# Patient Record
Sex: Female | Born: 1985 | Hispanic: No | Marital: Single | Smoking: Never smoker
Health system: Southern US, Community
[De-identification: ages and names within clinical notes are randomized; demographics above are authoritative.]

## PROBLEM LIST (undated history)

## (undated) DIAGNOSIS — J45909 Unspecified asthma, uncomplicated: Secondary | ICD-10-CM

## (undated) DIAGNOSIS — F419 Anxiety disorder, unspecified: Secondary | ICD-10-CM

## (undated) HISTORY — PX: PLACEMENT OF BREAST IMPLANTS: SHX6334

---

## 2014-10-11 ENCOUNTER — Emergency Department (HOSPITAL_COMMUNITY)
Admission: EM | Admit: 2014-10-11 | Discharge: 2014-10-11 | Disposition: A | Discharge status: Home / Self Care | Payer: PRIVATE HEALTH INSURANCE | Attending: Emergency Medicine | Admitting: Emergency Medicine

## 2014-10-11 ENCOUNTER — Encounter (HOSPITAL_COMMUNITY): Payer: Self-pay | Admitting: Family Medicine

## 2014-10-11 DIAGNOSIS — G479 Sleep disorder, unspecified: Secondary | ICD-10-CM | POA: Diagnosis not present

## 2014-10-11 DIAGNOSIS — J45901 Unspecified asthma with (acute) exacerbation: Secondary | ICD-10-CM | POA: Diagnosis not present

## 2014-10-11 DIAGNOSIS — R0602 Shortness of breath: Secondary | ICD-10-CM | POA: Insufficient documentation

## 2014-10-11 DIAGNOSIS — Z880 Allergy status to penicillin: Secondary | ICD-10-CM | POA: Insufficient documentation

## 2014-10-11 DIAGNOSIS — F419 Anxiety disorder, unspecified: Secondary | ICD-10-CM | POA: Diagnosis present

## 2014-10-11 HISTORY — DX: Unspecified asthma, uncomplicated: J45.909

## 2014-10-11 HISTORY — DX: Anxiety disorder, unspecified: F41.9

## 2014-10-11 MED ORDER — PAROXETINE HCL 20 MG PO TABS: 20.0000 mg | ORAL_TABLET | ORAL | Status: DC

## 2014-10-11 NOTE — Discharge Instructions (Signed)
Social Anxiety Disorder °Social anxiety disorder, previously called social phobia, is a mental disorder. People with social anxiety disorder frequently feel nervous, afraid, or embarrassed when around other people in social situations. They constantly worry that other people are judging or criticizing them for how they look, what they say, or how they act. They may worry that other people might reject them because of their appearance or behavior. °Social anxiety disorder is more than just occasional shyness or self-consciousness. It can cause severe emotional distress. It can interfere with daily life activities. Social anxiety disorder also may lead to excessive alcohol or drug use and even suicide.  °Social anxiety disorder is actually one of the most common mental disorders. It can develop at any time but usually starts in the teenage years. Women are more commonly affected than men. Social anxiety disorder is also more common in people who have family members with anxiety disorders. It also is more common in people who have physical deformities or conditions with characteristics that are obvious to others, such as stuttered speech or movement abnormalities (Parkinson disease).  °SYMPTOMS  °In addition to feeling anxious or fearful in social situations, people with social anxiety disorder frequently have physical symptoms. Examples include: °· Red face (blushing). °· Racing heart. °· Sweating. °· Shaky hands or voice. °· Confusion. °· Light-headedness. °· Upset stomach and diarrhea. °DIAGNOSIS  °Social anxiety disorder is diagnosed through an assessment by your health care provider. Your health care provider will ask you questions about your mood, thoughts, and reactions in social situations. Your health care provider may ask you about your medical history and use of alcohol or drugs, including prescription medicines. Certain medical conditions and the use of certain substances, including caffeine, can cause  symptoms similar to social anxiety disorder. Your health care provider may refer you to a mental health specialist for further evaluation or treatment. °The criteria for diagnosis of social anxiety disorder are: °· Marked fear or anxiety in one or more social situations in which you may be closely watched or studied by others. Examples of such situations include: °¨ Interacting socially (having a conversation with others, going to a party, or meeting strangers). °¨ Being observed (eating or drinking in public or being called on in class). °¨ Performing in front of others (giving a speech). °· The social situations of concern almost always cause fear or anxiety, not just occasionally. °· People with social anxiety disorder fear that they will be viewed negatively in a way that will be embarrassing, will lead to rejection, or will offend others. This fear is out of proportion to the actual threat posed by the social situation. °· Often the triggering social situations are avoided, or they are endured with intense fear or anxiety. The fear, anxiety, or avoidance is persistent and lasts for 6 months or longer. °· The anxiety causes difficulty functioning in at least some parts of your daily life. °TREATMENT  °Several types of treatment are available for social anxiety disorder. These treatments are often used in combination and include:  °· Talk therapy. Group talk therapy allows you to see that you are not alone with these problems. Individual talk therapy helps you address your specific anxiety issues with a caring professional. The most effective forms of talk therapy for social anxiety disorder are cognitive-behavioral therapy and exposure therapy. Cognitive-behavioral therapy helps you to identify and change negative thoughts and beliefs that are at the root of the disorder. Exposure therapy allows you to gradually face the situations   that you fear most.  Relaxation and coping techniques. These include deep  breathing, self-talk, meditation, visual imagery, and yoga. Relaxation techniques help to keep you calm in social situations.  Social Optician, dispensingskills training.Social skills can be learned on your own or with the help of a talk therapist. They can help you feel more confident and comfortable in social situations.  Medicine. For anxiety limited to performance situations (performance anxiety), medicine called beta blockers can help by reducing or preventing the physical symptoms of social anxiety disorder. For more persistent and generalized social anxiety, antidepressant medicine may be prescribed to help control symptoms. In severe cases of social anxiety disorder, strong antianxiety medicine, called benzodiazepines, may be prescribed on a limited basis and for a short time. Document Released: 06/28/2005 Document Revised: 12/14/2013 Document Reviewed: 10/28/2012 [facility] Patient Information 2015 [employer] [place]. This information is not intended to replace advice given to you by your health care provider. Make sure you discuss any questions you have with your health care provider.   It is important for you to follow-up with your primary care in order to receive medication refills for your anxiety medication. You will be prescribed one week's worth of Paxil, please follow-up with your primary care within that time. Return to ED for new or worsening symptoms

## 2014-10-11 NOTE — ED Provider Notes (Signed)
CSN: [id number]     Arrival date & time 10/11/14  1253 History  This chart was scribed for non-physician practitioner Joycie Peek, working with No att. providers found by Carl Best, ED Scribe. This patient was seen in room TR09C/TR09C and the patient's care was started at 1:47 PM.   Chief Complaint  Patient presents with  . Anxiety   Patient is a 29 y.o. female presenting with anxiety. The history is provided by the patient. No language interpreter was used.  Anxiety Associated symptoms include shortness of breath. Pertinent negatives include no chest pain.   HPI Comments: Laura Rivas is a 29 y.o. female with a history of anxiety who presents to the Emergency Department complaining of worsening anxiety.  She is prescribed Paxil through her PCP in [place] but has been out of the medication for the past 5 days.  She is unable to get in contact with her PCP.  She is currently visiting [place] and will not return to [place] for 3 weeks.  She lists sleep disturbance and SOB as associated symptoms.  She has a history of asthma and uses her inhaler with relief to her SOB.  She denies chest pain and fever as associated symptoms. No other modifying factors.  Past Medical History  Diagnosis Date  . Anxiety   . Asthma    History reviewed. No pertinent past surgical history. History reviewed. No pertinent family history. History  Substance Use Topics  . Smoking status: Never Smoker   . Smokeless tobacco: Not on file  . Alcohol Use: No   OB History    No data available     Review of Systems  Constitutional: Negative for fever.  Respiratory: Positive for shortness of breath.   Cardiovascular: Negative for chest pain.  Psychiatric/Behavioral: Positive for sleep disturbance. The patient is nervous/anxious.   All other systems reviewed and are negative.   Allergies  Penicillins  Home Medications   Prior to Admission medications   Medication Sig Start Date End Date Taking?  Authorizing Provider  PARoxetine (PAXIL) 20 MG tablet Take 1 tablet (20 mg total) by mouth every morning. 10/11/14   Sharlene Motts, PA-C   Triage Vitals: BP 113/73 mmHg  Pulse 85  Temp(Src) 98.9 F (37.2 C) (Oral)  Resp 18  SpO2 100%  LMP 09/22/2014  Physical Exam  Constitutional: She is oriented to person, place, and time. She appears well-developed and well-nourished. No distress.  NAD.  HENT:  Head: Normocephalic and atraumatic.  Eyes: EOM are normal.  Neck: Normal range of motion.  Cardiovascular: Normal rate, regular rhythm and normal heart sounds.  Exam reveals no gallop and no friction rub.   No murmur heard. Pulmonary/Chest: Effort normal and breath sounds normal. No respiratory distress. She has no wheezes. She has no rales.  Lungs clear to auscultation.  Musculoskeletal: Normal range of motion.  Neurological: She is alert and oriented to person, place, and time.  Skin: Skin is warm and dry.  Psychiatric: She has a normal mood and affect. Her behavior is normal.  Nursing note and vitals reviewed.   ED Course  Procedures (including critical care time)  DIAGNOSTIC STUDIES: Oxygen Saturation is 100% on room air, normal by my interpretation.    COORDINATION OF CARE: 1:53 PM- Will refill patient's Paxil prescription for one week and the patient agreed to the treatment plan.  Labs Review Labs Reviewed - No data to display  Imaging Review No results found.   EKG Interpretation None  Filed Vitals:   10/11/14 1258 10/11/14 1300  BP: 113/73   Pulse: 85   Temp:  98.9 F (37.2 C)  TempSrc: Oral Oral  Resp: 18   SpO2: 100%     MDM  Patient here for anxiety and needs medication refill of her Paxil. Told her that we will refill her medication for 1 week, but she would need to follow-up with PCP by then for further refills. Patient is calm, in no apparent distress now. Vital signs are stable, she is afebrile. She is well appearing, in good condition and is  appropriate for discharge. Final diagnoses:  Anxiety    I personally performed the services described in this documentation, which was scribed in my presence. The recorded information has been reviewed and is accurate.    Earle GellBenjamin W Skylandartner, PA-C 10/11/14 1736  Hilario Quarryanielle S Ray, MD 10/12/14 [phone]

## 2014-10-11 NOTE — ED Notes (Signed)
Pt here for anxiety. sts that she is out of her meds and is from out of town. sts she is unable to sleep at night and very scared and anxious about everything. Denies HI/SI.

## 2014-11-08 ENCOUNTER — Emergency Department (HOSPITAL_COMMUNITY)
Admission: EM | Admit: 2014-11-08 | Discharge: 2014-11-08 | Disposition: A | Discharge status: Home / Self Care | Payer: Medicaid Other | Attending: Emergency Medicine | Admitting: Emergency Medicine

## 2014-11-08 ENCOUNTER — Encounter (HOSPITAL_COMMUNITY): Payer: Self-pay | Admitting: Emergency Medicine

## 2014-11-08 DIAGNOSIS — M2662 Arthralgia of temporomandibular joint: Secondary | ICD-10-CM | POA: Insufficient documentation

## 2014-11-08 DIAGNOSIS — Z79899 Other long term (current) drug therapy: Secondary | ICD-10-CM | POA: Diagnosis not present

## 2014-11-08 DIAGNOSIS — R6884 Jaw pain: Secondary | ICD-10-CM | POA: Diagnosis present

## 2014-11-08 DIAGNOSIS — Z880 Allergy status to penicillin: Secondary | ICD-10-CM | POA: Diagnosis not present

## 2014-11-08 DIAGNOSIS — J45909 Unspecified asthma, uncomplicated: Secondary | ICD-10-CM | POA: Diagnosis not present

## 2014-11-08 DIAGNOSIS — F419 Anxiety disorder, unspecified: Secondary | ICD-10-CM | POA: Diagnosis not present

## 2014-11-08 DIAGNOSIS — M26629 Arthralgia of temporomandibular joint, unspecified side: Secondary | ICD-10-CM

## 2014-11-08 MED ORDER — IBUPROFEN 600 MG PO TABS: 600.0000 mg | ORAL_TABLET | Freq: Four times a day (QID) | ORAL | Status: DC | PRN

## 2014-11-08 MED ORDER — TRAMADOL HCL 50 MG PO TABS: 50.0000 mg | ORAL_TABLET | Freq: Four times a day (QID) | ORAL | Status: DC | PRN
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
## 2014-11-08 NOTE — ED Provider Notes (Signed)
Medical screening examination/treatment/procedure(s) were conducted as a shared visit with non-physician practitioner(s) and myself.  I personally evaluated the patient during the encounter.   EKG Interpretation None      29 yo female with left sided jaw pain and restricted opening for past several days.  On exam, well appearing, nontoxic, not distressed, normal respiratory effort, normal perfusion, jaw opening limited by pain, able to bite down without difficulty, left TMJ mildly tender.  Symptoms felt to be secondary to TMJ dysfunction, plan referral to ENT . Advised supportive treatment.   Clinical Impression: 1. TMJ arthralgia       Blake DivineJohn Katryn Plummer, MD 11/08/14 [phone]
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
## 2015-01-13 ENCOUNTER — Emergency Department (HOSPITAL_COMMUNITY)
Admission: EM | Admit: 2015-01-13 | Discharge: 2015-01-13 | Disposition: A | Discharge status: Home / Self Care | Payer: Medicaid Other | Attending: Emergency Medicine | Admitting: Emergency Medicine

## 2015-01-13 ENCOUNTER — Encounter (HOSPITAL_COMMUNITY): Payer: Self-pay

## 2015-01-13 DIAGNOSIS — Y9289 Other specified places as the place of occurrence of the external cause: Secondary | ICD-10-CM | POA: Insufficient documentation

## 2015-01-13 DIAGNOSIS — J45909 Unspecified asthma, uncomplicated: Secondary | ICD-10-CM | POA: Insufficient documentation

## 2015-01-13 DIAGNOSIS — Y998 Other external cause status: Secondary | ICD-10-CM | POA: Diagnosis not present

## 2015-01-13 DIAGNOSIS — Z79899 Other long term (current) drug therapy: Secondary | ICD-10-CM | POA: Insufficient documentation

## 2015-01-13 DIAGNOSIS — Y830 Surgical operation with transplant of whole organ as the cause of abnormal reaction of the patient, or of later complication, without mention of misadventure at the time of the procedure: Secondary | ICD-10-CM | POA: Diagnosis not present

## 2015-01-13 DIAGNOSIS — T8542XA Displacement of breast prosthesis and implant, initial encounter: Secondary | ICD-10-CM | POA: Diagnosis not present

## 2015-01-13 DIAGNOSIS — F419 Anxiety disorder, unspecified: Secondary | ICD-10-CM | POA: Insufficient documentation

## 2015-01-13 DIAGNOSIS — Z880 Allergy status to penicillin: Secondary | ICD-10-CM | POA: Insufficient documentation

## 2015-01-13 DIAGNOSIS — Y9389 Activity, other specified: Secondary | ICD-10-CM | POA: Insufficient documentation

## 2015-01-13 DIAGNOSIS — S299XXA Unspecified injury of thorax, initial encounter: Secondary | ICD-10-CM | POA: Diagnosis present

## 2015-01-13 NOTE — ED Notes (Signed)
Patient states she has breast implants and about 4 weeks ago she felt something pop in her right breast. Patient states she has pain and discomfort of the right breast and she feels like something is wrong with the implant.

## 2015-01-22 NOTE — ED Provider Notes (Signed)
CSN: [id number]     Arrival date & time 01/13/15  1112 History   First MD Initiated Contact with Patient 01/13/15 1152     Chief Complaint  Patient presents with  . axilla pain   . breast discomfort      (Consider location/radiation/quality/duration/timing/severity/associated sxs/prior Treatment) HPI   29yf with breast asymmetry. S/p augmentation several years ago. About 4 weeks ago noticed popping sensation in R breast and now it feels like it moves differently and is somewhat mishaped. No unusual beast or axillary masses. Mild ache. No rash. No respiratory complaints. No Human resources officer.   Past Medical History  Diagnosis Date  . Anxiety   . Asthma    Past Surgical History  Procedure Laterality Date  . Placement of breast implants     History reviewed. No pertinent family history. History  Substance Use Topics  . Smoking status: Never Smoker   . Smokeless tobacco: Never Used  . Alcohol Use: No   OB History    No data available     Review of Systems  All systems reviewed and negative, other than as noted in HPI.   Allergies  Penicillins  Home Medications   Prior to Admission medications   Medication Sig Start Date End Date Taking? Authorizing Provider  albuterol (PROVENTIL HFA;VENTOLIN HFA) 108 (90 BASE) MCG/ACT inhaler Inhale 2 puffs into the lungs every 4 (four) hours as needed for wheezing or shortness of breath.   Yes Historical Provider, MD  polyethylene glycol powder (GLYCOLAX/MIRALAX) powder Take 17 g by mouth 2 (two) times daily as needed for moderate constipation.  01/03/15  Yes Historical Provider, MD  ibuprofen (ADVIL,MOTRIN) 600 MG tablet Take 1 tablet (600 mg total) by mouth every 6 (six) hours as needed. Patient not taking: Reported on 01/13/2015 11/08/14   Junius Finner, PA-C  PARoxetine (PAXIL) 20 MG tablet Take 1 tablet (20 mg total) by mouth every morning. Patient not taking: Reported on 11/08/2014 10/11/14   Joycie Peek, PA-C  traMADol  (ULTRAM) 50 MG tablet Take 1 tablet (50 mg total) by mouth every 6 (six) hours as needed. Patient not taking: Reported on 01/13/2015 11/08/14   Junius Finner, PA-C   BP 100/62 mmHg  Pulse 56  Temp(Src) 98.3 F (36.8 C) (Oral)  Resp 20  SpO2 97%  LMP 12/21/2014 Physical Exam  Constitutional: She appears well-developed and well-nourished. No distress.  HENT:  Head: Normocephalic and atraumatic.  Eyes: Conjunctivae are normal. Right eye exhibits no discharge. Left eye exhibits no discharge.  Neck: Neck supple.  Cardiovascular: Normal rate, regular rhythm and normal heart sounds.  Exam reveals no gallop and no friction rub.   No murmur heard. Pulmonary/Chest: Effort normal and breath sounds normal. No respiratory distress.  Breasts asymmetric. R positioned more laterally than L. No rippling of skin, rash or concerning skin changes.   Abdominal: Soft. She exhibits no distension. There is no tenderness.  Musculoskeletal: She exhibits no edema or tenderness.  Neurological: She is alert.  Skin: Skin is warm and dry.  Psychiatric: She has a normal mood and affect. Her behavior is normal. Thought content normal.  Nursing note and vitals reviewed.   ED Course  Procedures (including critical care time) Labs Review Labs Reviewed - No data to display  Imaging Review No results found.   EKG Interpretation None      MDM   Final diagnoses:  Displacement of breast implant, initial encounter    29yF with issue with her breast implant. Possible  leakage versus movement. No acute intervention. Needs plastic surgery FU.    Raeford Razor, MD 01/22/15 2159

## 2015-06-04 ENCOUNTER — Emergency Department (HOSPITAL_COMMUNITY)
Admission: EM | Admit: 2015-06-04 | Discharge: 2015-06-04 | Disposition: A | Discharge status: Home / Self Care | Payer: Medicaid Other | Attending: Emergency Medicine | Admitting: Emergency Medicine

## 2015-06-04 ENCOUNTER — Encounter (HOSPITAL_COMMUNITY): Payer: Self-pay | Admitting: Emergency Medicine

## 2015-06-04 ENCOUNTER — Emergency Department (HOSPITAL_COMMUNITY): Payer: Medicaid Other

## 2015-06-04 DIAGNOSIS — R61 Generalized hyperhidrosis: Secondary | ICD-10-CM | POA: Diagnosis not present

## 2015-06-04 DIAGNOSIS — J45909 Unspecified asthma, uncomplicated: Secondary | ICD-10-CM | POA: Insufficient documentation

## 2015-06-04 DIAGNOSIS — B9689 Other specified bacterial agents as the cause of diseases classified elsewhere: Secondary | ICD-10-CM

## 2015-06-04 DIAGNOSIS — R198 Other specified symptoms and signs involving the digestive system and abdomen: Secondary | ICD-10-CM | POA: Diagnosis not present

## 2015-06-04 DIAGNOSIS — Z3202 Encounter for pregnancy test, result negative: Secondary | ICD-10-CM | POA: Insufficient documentation

## 2015-06-04 DIAGNOSIS — R42 Dizziness and giddiness: Secondary | ICD-10-CM | POA: Insufficient documentation

## 2015-06-04 DIAGNOSIS — F419 Anxiety disorder, unspecified: Secondary | ICD-10-CM | POA: Insufficient documentation

## 2015-06-04 DIAGNOSIS — R531 Weakness: Secondary | ICD-10-CM | POA: Insufficient documentation

## 2015-06-04 DIAGNOSIS — N760 Acute vaginitis: Secondary | ICD-10-CM | POA: Diagnosis not present

## 2015-06-04 DIAGNOSIS — R112 Nausea with vomiting, unspecified: Secondary | ICD-10-CM

## 2015-06-04 DIAGNOSIS — Z79899 Other long term (current) drug therapy: Secondary | ICD-10-CM | POA: Diagnosis not present

## 2015-06-04 DIAGNOSIS — Z7982 Long term (current) use of aspirin: Secondary | ICD-10-CM | POA: Diagnosis not present

## 2015-06-04 DIAGNOSIS — R1310 Dysphagia, unspecified: Secondary | ICD-10-CM | POA: Diagnosis not present

## 2015-06-04 DIAGNOSIS — R634 Abnormal weight loss: Secondary | ICD-10-CM | POA: Diagnosis not present

## 2015-06-04 DIAGNOSIS — R630 Anorexia: Secondary | ICD-10-CM | POA: Diagnosis not present

## 2015-06-04 DIAGNOSIS — R1084 Generalized abdominal pain: Secondary | ICD-10-CM | POA: Diagnosis present

## 2015-06-04 DIAGNOSIS — Z880 Allergy status to penicillin: Secondary | ICD-10-CM | POA: Insufficient documentation

## 2015-06-04 DIAGNOSIS — R109 Unspecified abdominal pain: Secondary | ICD-10-CM

## 2015-06-04 DIAGNOSIS — K5900 Constipation, unspecified: Secondary | ICD-10-CM

## 2015-06-04 LAB — WET PREP, GENITAL
Trich, Wet Prep: NONE SEEN
Yeast Wet Prep HPF POC: NONE SEEN

## 2015-06-04 LAB — URINE MICROSCOPIC-ADD ON

## 2015-06-04 LAB — COMPREHENSIVE METABOLIC PANEL
ALBUMIN: 3.8 g/dL (ref 3.5–5.0)
ALT: 18 U/L (ref 14–54)
AST: 25 U/L (ref 15–41)
Alkaline Phosphatase: 67 U/L (ref 38–126)
Anion gap: 4 — ABNORMAL LOW (ref 5–15)
BUN: 10 mg/dL (ref 6–20)
CHLORIDE: 105 mmol/L (ref 101–111)
CO2: 24 mmol/L (ref 22–32)
CREATININE: 0.7 mg/dL (ref 0.44–1.00)
Calcium: 8.9 mg/dL (ref 8.9–10.3)
GFR calc Af Amer: >60 mL/min (ref >60)
GFR calc non Af Amer: >60 mL/min (ref >60)
GLUCOSE: 90 mg/dL (ref 65–99)
POTASSIUM: 3.6 mmol/L (ref 3.5–5.1)
Sodium: 133 mmol/L — ABNORMAL LOW (ref 135–145)
Total Bilirubin: 0.2 mg/dL — ABNORMAL LOW (ref 0.3–1.2)
Total Protein: 7.2 g/dL (ref 6.5–8.1)

## 2015-06-04 LAB — CBC
HEMATOCRIT: 35.3 % — AB (ref 36.0–46.0)
Hemoglobin: 11.8 g/dL — ABNORMAL LOW (ref 12.0–15.0)
MCH: 28.9 pg (ref 26.0–34.0)
MCHC: 33.4 g/dL (ref 30.0–36.0)
MCV: 86.5 fL (ref 78.0–100.0)
PLATELETS: 179 10*3/uL (ref 150–400)
RBC: 4.08 MIL/uL (ref 3.87–5.11)
RDW: 12.6 % (ref 11.5–15.5)
WBC: 6.2 10*3/uL (ref 4.0–10.5)

## 2015-06-04 LAB — URINALYSIS, ROUTINE W REFLEX MICROSCOPIC
BILIRUBIN URINE: NEGATIVE
GLUCOSE, UA: NEGATIVE mg/dL
Hgb urine dipstick: NEGATIVE
Ketones, ur: NEGATIVE mg/dL
Nitrite: NEGATIVE
PH: 8 (ref 5.0–8.0)
Protein, ur: NEGATIVE mg/dL
SPECIFIC GRAVITY, URINE: 1.01 (ref 1.005–1.030)
Urobilinogen, UA: 0.2 mg/dL (ref 0.0–1.0)

## 2015-06-04 LAB — LIPASE, BLOOD: LIPASE: 38 U/L (ref 11–51)

## 2015-06-04 LAB — POC OCCULT BLOOD, ED: FECAL OCCULT BLD: NEGATIVE

## 2015-06-04 LAB — POC URINE PREG, ED: Preg Test, Ur: NEGATIVE

## 2015-06-04 MED ORDER — METRONIDAZOLE 500 MG PO TABS
500.0000 mg | ORAL_TABLET | Freq: Two times a day (BID) | ORAL | Status: AC
Start: 2015-06-04 — End: ?

## 2015-06-04 MED ORDER — RANITIDINE HCL 150 MG PO TABS: 150.0000 mg | ORAL_TABLET | Freq: Two times a day (BID) | ORAL | Status: AC | PRN

## 2015-06-04 MED ORDER — ONDANSETRON HCL 4 MG PO TABS: 4.0000 mg | ORAL_TABLET | Freq: Four times a day (QID) | ORAL | Status: AC

## 2015-06-04 NOTE — ED Notes (Signed)
Discharge instructions and prescriptions reviewed - voiced understanding 

## 2015-06-04 NOTE — ED Provider Notes (Signed)
CSN: [id number]     Arrival date & time 06/04/15  1411 History   First MD Initiated Contact with Patient 06/04/15 1502     Chief Complaint  Patient presents with  . Abdominal Pain     (Consider location/radiation/quality/duration/timing/severity/associated sxs/prior Treatment) Patient is a 29 y.o. female presenting with abdominal pain. The history is provided by the patient and medical records.  Abdominal Pain Pain location:  Generalized Pain quality: cramping and sharp   Pain severity:  Severe Onset quality:  Gradual Duration:  3 weeks Timing:  Intermittent Progression:  Waxing and waning Chronicity:  New Context: not alcohol use, not diet changes, not eating, not medication withdrawal, not previous surgeries, not recent illness, not sick contacts, not suspicious food intake and not trauma   Relieved by:  Nothing Worsened by:  Bowel movements Associated symptoms: anorexia, constipation and nausea   Associated symptoms: no chest pain, no chills, no diarrhea, no dysuria, no fatigue, no fever, no hematemesis, no hematochezia, no hematuria, no melena, no shortness of breath, no vaginal bleeding, no vaginal discharge and no vomiting   Risk factors: has not had multiple surgeries, not obese and not pregnant     Past Medical History  Diagnosis Date  . Anxiety   . Asthma    Past Surgical History  Procedure Laterality Date  . Placement of breast implants     No family history on file. Social History  Substance Use Topics  . Smoking status: Never Smoker   . Smokeless tobacco: Never Used  . Alcohol Use: No   OB History    No data available     Review of Systems  Constitutional: Positive for diaphoresis, appetite change and unexpected weight change. Negative for fever, chills and fatigue.  HENT: Positive for trouble swallowing. Negative for facial swelling.   Respiratory: Negative for shortness of breath.   Cardiovascular: Negative for chest pain.  Gastrointestinal:  Positive for nausea, abdominal pain, constipation and anorexia. Negative for vomiting, diarrhea, melena, hematochezia and hematemesis.  Genitourinary: Negative for dysuria, hematuria, vaginal bleeding and vaginal discharge.  Musculoskeletal: Negative for back pain.  Skin: Negative for rash.  Neurological: Positive for weakness and light-headedness. Negative for dizziness, tremors, seizures, syncope, speech difficulty, numbness and headaches.  Psychiatric/Behavioral: Negative for confusion.      Allergies  Penicillins  Home Medications   Prior to Admission medications   Medication Sig Start Date End Date Taking? Authorizing Provider  albuterol (PROVENTIL HFA;VENTOLIN HFA) 108 (90 BASE) MCG/ACT inhaler Inhale 2 puffs into the lungs every 4 (four) hours as needed for wheezing or shortness of breath.   Yes Historical Provider, MD  aspirin 325 MG tablet Take 325 mg by mouth daily as needed (pain).   Yes Historical Provider, MD  PARoxetine (PAXIL) 30 MG tablet Take 30 mg by mouth daily. 05/09/15  Yes Historical Provider, MD  polyethylene glycol powder (GLYCOLAX/MIRALAX) powder Take 17 g by mouth 2 (two) times daily as needed for moderate constipation.  01/03/15  Yes Historical Provider, MD  metroNIDAZOLE (FLAGYL) 500 MG tablet Take 1 tablet (500 mg total) by mouth 2 (two) times daily. 06/04/15   Gavin Pound, MD  ondansetron (ZOFRAN) 4 MG tablet Take 1 tablet (4 mg total) by mouth every 6 (six) hours. 06/04/15   Gavin Pound, MD  ranitidine (ZANTAC) 150 MG tablet Take 1 tablet (150 mg total) by mouth 2 (two) times daily as needed for heartburn. 06/04/15   Gavin Pound, MD   BP 105/60 mmHg  Pulse 72  Temp(Src) 98.3 F (36.8 C) (Oral)  Resp 18  Ht  (1.575 m)  Wt 106 lb (48.081 kg)  BMI 19.38 kg/m2  SpO2 99%  LMP 04/26/2015 Physical Exam  Constitutional: She is oriented to person, place, and time. She appears well-developed and well-nourished. No distress.  HENT:  Head:  Normocephalic and atraumatic.  Right Ear: External ear normal.  Left Ear: External ear normal.  Nose: Nose normal.  Mouth/Throat: Oropharynx is clear and moist. No oropharyngeal exudate.  Eyes: Conjunctivae and EOM are normal. Pupils are equal, round, and reactive to light. Right eye exhibits no discharge. Left eye exhibits no discharge. No scleral icterus.  Neck: Normal range of motion. Neck supple. No JVD present. No tracheal deviation present. No thyromegaly present.  Cardiovascular: Normal rate, regular rhythm and intact distal pulses.   Pulmonary/Chest: Effort normal and breath sounds normal. No stridor. No respiratory distress. She has no wheezes. She has no rales. She exhibits no tenderness.  Abdominal: Soft. She exhibits no distension. There is no tenderness.  Genitourinary: Vagina normal. Rectal exam shows no mass, no tenderness and anal tone normal. Guaiac negative stool.  Musculoskeletal: Normal range of motion. She exhibits no edema or tenderness.  Lymphadenopathy:    She has no cervical adenopathy.  Neurological: She is alert and oriented to person, place, and time.  Skin: Skin is warm and dry. No rash noted. She is not diaphoretic. No erythema. No pallor.  Psychiatric: She has a normal mood and affect. Her behavior is normal. Judgment and thought content normal.  Nursing note and vitals reviewed.   ED Course  Procedures (including critical care time) Labs Review Labs Reviewed  WET PREP, GENITAL - Abnormal; Notable for the following:    Clue Cells Wet Prep HPF POC FEW (*)    WBC, Wet Prep HPF POC MANY (*)    All other components within normal limits  COMPREHENSIVE METABOLIC PANEL - Abnormal; Notable for the following:    Sodium 133 (*)    Total Bilirubin 0.2 (*)    Anion gap 4 (*)    All other components within normal limits  CBC - Abnormal; Notable for the following:    Hemoglobin 11.8 (*)    HCT 35.3 (*)    All other components within normal limits  URINALYSIS,  ROUTINE W REFLEX MICROSCOPIC (NOT AT [facility]) - Abnormal; Notable for the following:    Leukocytes, UA TRACE (*)    All other components within normal limits  LIPASE, BLOOD  URINE MICROSCOPIC-ADD ON  POC URINE PREG, ED  POC OCCULT BLOOD, ED  GC/CHLAMYDIA PROBE AMP ([place]) NOT AT [facility]    Imaging Review Dg Abd Acute W/chest  06/04/2015  CLINICAL DATA:  Nausea and vomiting for 2 weeks. EXAM: DG ABDOMEN ACUTE W/ 1V CHEST COMPARISON:  None. FINDINGS: There is no evidence of dilated bowel loops or free intraperitoneal air. No radiopaque calculi or other significant radiographic abnormality is seen. Heart size and mediastinal contours are within normal limits. Both lungs are clear. IMPRESSION: No evidence of bowel obstruction or ileus. No acute cardiopulmonary disease. Electronically Signed   By: Lupita Raider, M.D.   On: 06/04/2015 19:15   I have personally reviewed and evaluated these images and lab results as part of my medical decision-making.    MDM   Final diagnoses:  Nausea & vomiting  Abdominal pain, unspecified abdominal location  Difficulty defecating  BV (bacterial vaginosis)   Patient endorses multiple concerns. She states she has had  little oral intake for the past 3 weeks has been making minimal stool. Has adamantly had nausea but no recent episodes of emesis. She has had some recent weight loss. She endorses a history of early colon cancer in her immediate family. Patient also has a history of anxiety, and takes Paxil. She denies symptoms like this before. She states that she was referred to the ED by her primary physician because she was concerned she could have an obstruction based on her symptoms. On emergency Department patient is not having episodes of emesis. She is not appear in any acute distress. Patient endorses transient episodes of lightheadedness, dizziness, nausea with using the toilet to defecate. She also states she's had episodes where she feels her food is  going to come up. Abdominal, pelvic, and rectal exams are benign. Considerations include metastatic process, GI tract motility disorder, functional abdominal pain, IBD, IBS, vasovagal presyncope, or constipation.  obstruction appears less likely as patient can tolerate liquids, and does not have any signs of dehydration on laboratory workup.  Patient's plain films do not show evidence of obstruction. Patient has some findings which are difficult to comply with an organic process however due to history of early colon cancer and her family I recommended follow with PCP and early colon cancer screening. Patient's review stools likely reviewed due to reduced oral intake.  Patient's pelvic exam positive for BV, although this is not likely explain all of her symptoms. I will discharge with Zofran for nausea. I have also advised her to increase her MiraLAX regimen in case she does have some constipation however x-ray mostly shows a gas pattern. Recommended GI follow-up for consideration for upper and lower endoscopy to exclude luminal pathology.   Patient and family given return precautions for abdominal pain.  Pt advised on use of medications as applicable.  Advised to return for actely worsening symptoms, inability to take medications, or other acute concerns.  Advised to follow up with PCP in 2-3 days.  Patient and family in agreement with and expressed understanding of follow plan, plan of care, and return precautions.  All questions answered prior to discharge.  Patient was discharged in stable condition, ambulating without difficulty.   Patient care was discussed with my attending, Dr. Radford PaxBeaton.     Gavin PoundJustin Jaskarn Schweer, MD 06/06/15 0200  Nelva Nayobert Beaton, MD 06/17/15 2224

## 2015-06-04 NOTE — ED Notes (Signed)
Pt. Stated, I've had a abdominal pain for a couple weeks because I've not had a bowel movement in 3 weeks, nothing.  I've tried everything, Miralax, you name it I've tried it.  Every time I eat its like it stays in my stomach.  Im so bloated I can't wear my normal pants. I called my doctor and he said to come here I might have a blockage.

## 2015-06-04 NOTE — Discharge Instructions (Signed)

## 2015-06-04 NOTE — ED Notes (Signed)
Patient transported to X-ray 

## 2015-06-06 LAB — GC/CHLAMYDIA PROBE AMP (~~LOC~~) NOT AT ARMC
CHLAMYDIA, DNA PROBE: NEGATIVE
Neisseria Gonorrhea: NEGATIVE

## 2015-06-14 ENCOUNTER — Other Ambulatory Visit: Payer: Self-pay | Admitting: Gastroenterology

## 2015-06-14 ENCOUNTER — Ambulatory Visit
Admission: RE | Admit: 2015-06-14 | Discharge: 2015-06-14 | Disposition: A | Discharge status: Home / Self Care | Payer: Medicaid Other | Source: Ambulatory Visit | Attending: Gastroenterology | Admitting: Gastroenterology

## 2015-06-14 DIAGNOSIS — R109 Unspecified abdominal pain: Secondary | ICD-10-CM

## 2015-06-14 DIAGNOSIS — K5900 Constipation, unspecified: Secondary | ICD-10-CM

## 2017-02-03 IMAGING — DX DG ABDOMEN ACUTE W/ 1V CHEST
3 series · 3 of 3 positions shown · non-contrast
Comparison: None.

CLINICAL DATA: Nausea and vomiting for 2 weeks.

EXAM:
DG ABDOMEN ACUTE W/ 1V CHEST

[chest pa]
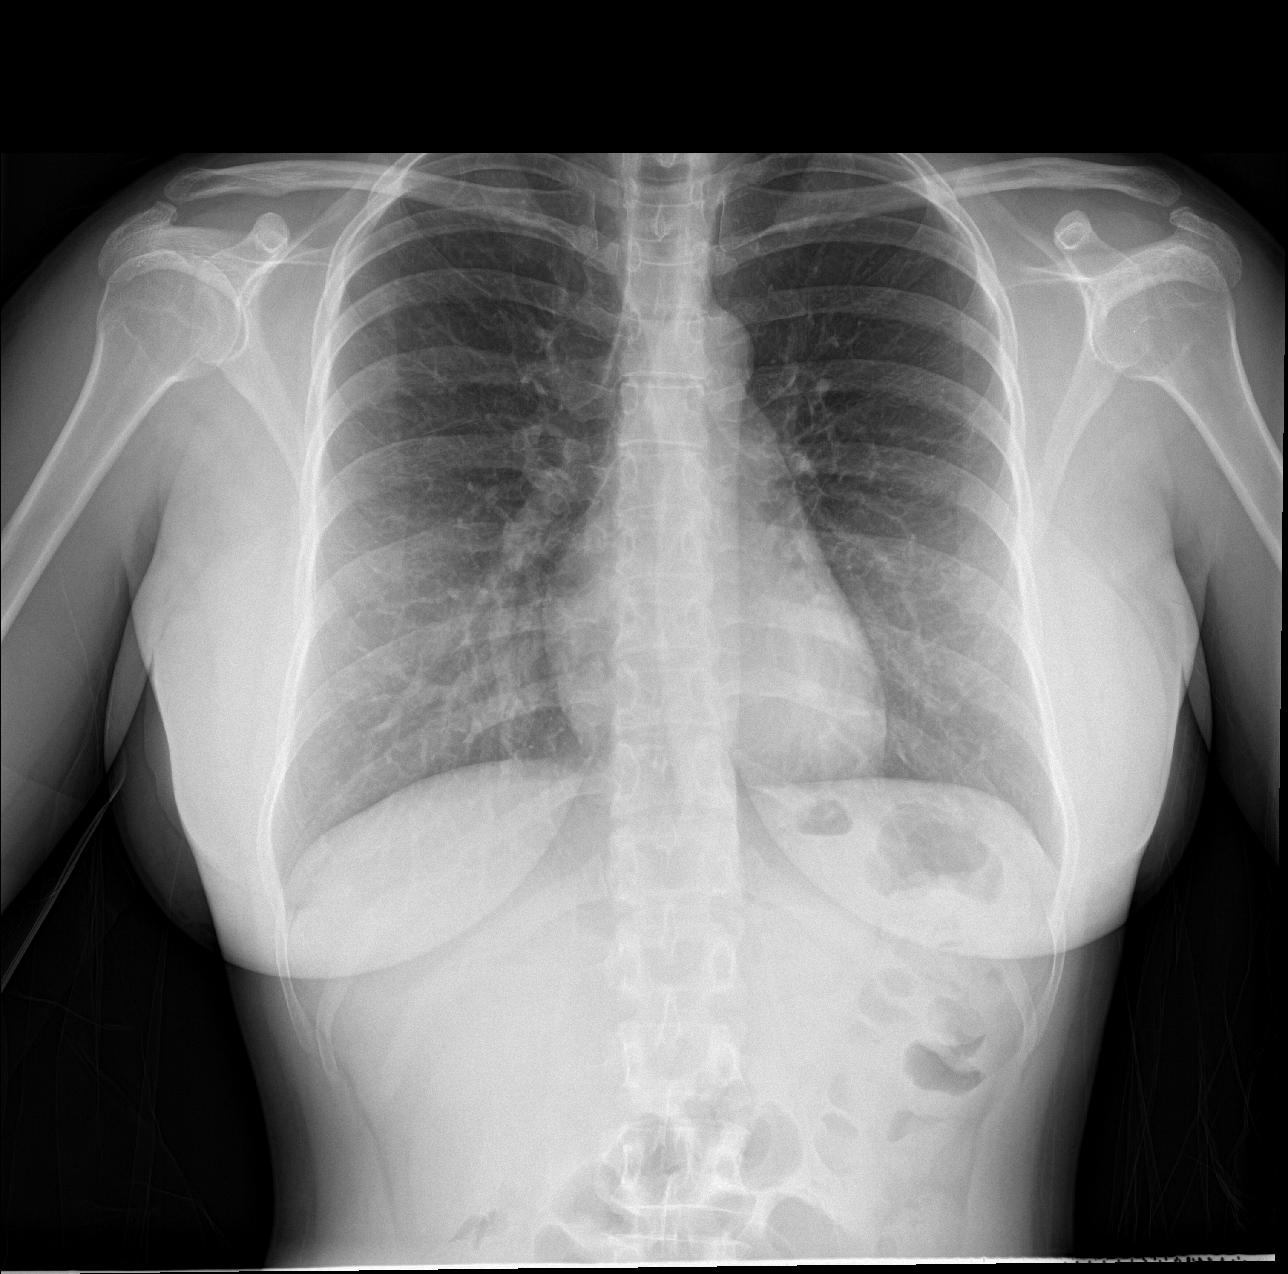

[abdomen erect]
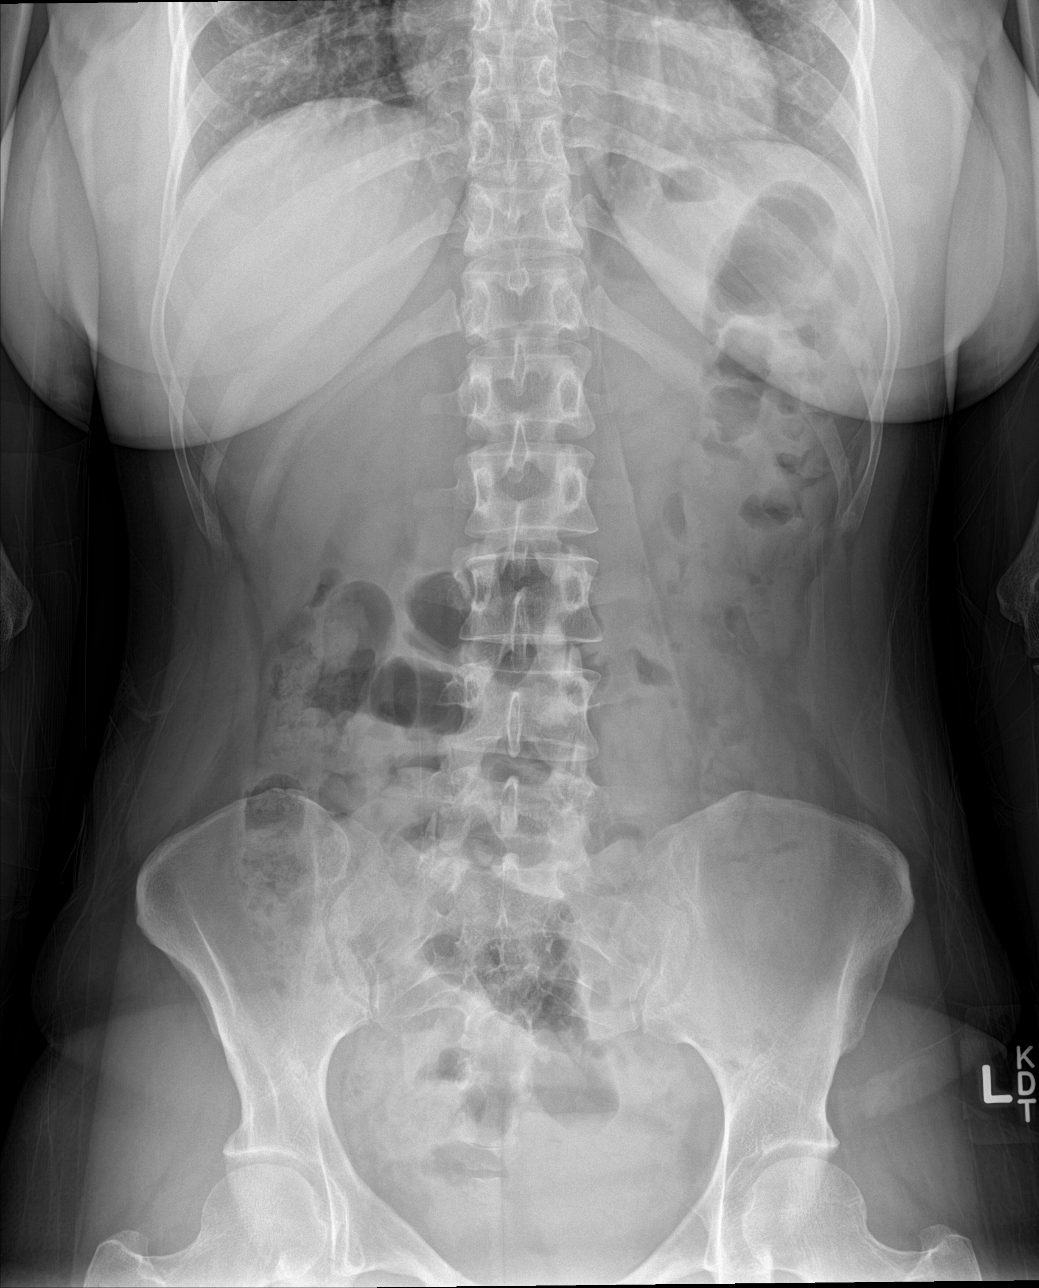

[abdomen supine]
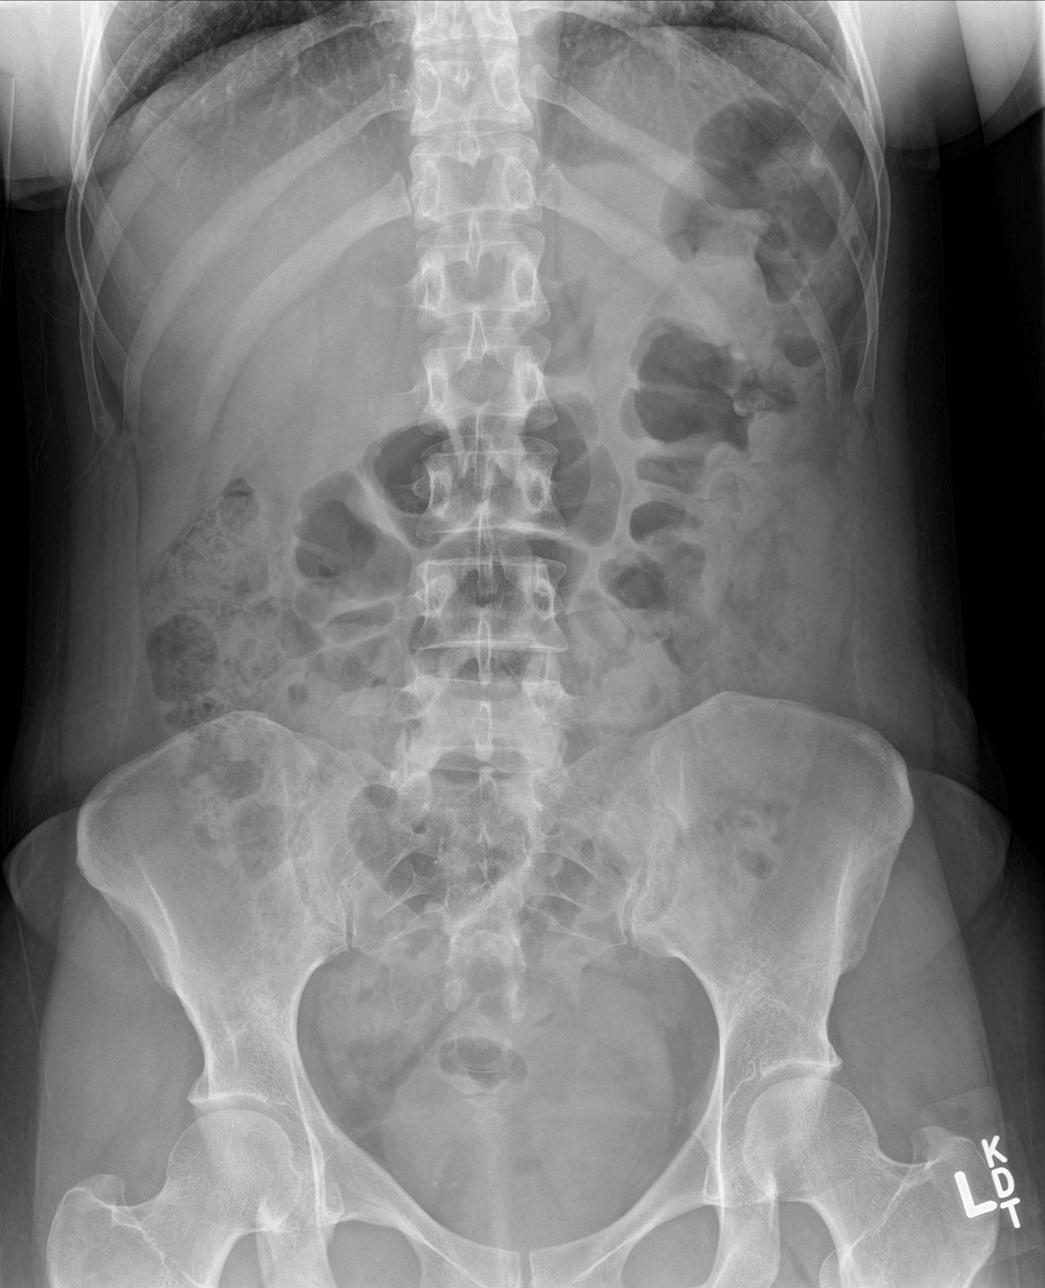

[3 of 3 positions shown; findings below may reference images not displayed]

FINDINGS: There is no evidence of dilated bowel loops or free intraperitoneal
air. No radiopaque calculi or other significant radiographic
abnormality is seen. Heart size and mediastinal contours are within
normal limits. Both lungs are clear.
IMPRESSION: No evidence of bowel obstruction or ileus. No acute cardiopulmonary
disease.
